# Patient Record
Sex: Female | Born: 2000 | Race: Asian | Hispanic: No | Marital: Single | Smoking: Never smoker
Health system: Southern US, Community
[De-identification: ages and names within clinical notes are randomized; demographics above are authoritative.]

## PROBLEM LIST (undated history)

## (undated) DIAGNOSIS — J302 Other seasonal allergic rhinitis: Secondary | ICD-10-CM

---
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
## 2021-01-24 ENCOUNTER — Ambulatory Visit (HOSPITAL_COMMUNITY)
Admission: EM | Admit: 2021-01-24 | Discharge: 2021-01-24 | Disposition: A | Discharge status: Home / Self Care | Payer: Medicaid Other | Attending: Physician Assistant | Admitting: Physician Assistant

## 2021-01-24 ENCOUNTER — Encounter (HOSPITAL_COMMUNITY): Payer: Self-pay

## 2021-01-24 ENCOUNTER — Other Ambulatory Visit: Payer: Self-pay

## 2021-01-24 DIAGNOSIS — N3001 Acute cystitis with hematuria: Secondary | ICD-10-CM | POA: Diagnosis not present

## 2021-01-24 DIAGNOSIS — R350 Frequency of micturition: Secondary | ICD-10-CM | POA: Insufficient documentation

## 2021-01-24 DIAGNOSIS — R300 Dysuria: Secondary | ICD-10-CM | POA: Insufficient documentation

## 2021-01-24 HISTORY — DX: Other seasonal allergic rhinitis: J30.2

## 2021-01-24 LAB — POCT URINALYSIS DIPSTICK, ED / UC
Bilirubin Urine: NEGATIVE
Glucose, UA: NEGATIVE mg/dL
Ketones, ur: NEGATIVE mg/dL
Nitrite: NEGATIVE
Protein, ur: NEGATIVE mg/dL
Specific Gravity, Urine: 1.01 (ref 1.005–1.030)
Urobilinogen, UA: 0.2 mg/dL (ref 0.0–1.0)
pH: 6.5 (ref 5.0–8.0)

## 2021-01-24 MED ORDER — NITROFURANTOIN MONOHYD MACRO 100 MG PO CAPS: 100.0000 mg | ORAL_CAPSULE | Freq: Two times a day (BID) | ORAL | 0 refills | Status: AC

## 2021-01-24 NOTE — Discharge Instructions (Signed)
Start antibiotics twice daily for a week.  We will contact you if we need to change antibiotics based on your culture results in a few days.  Make sure you are drinking plenty of fluid.  You can alternate Tylenol ibuprofen for pain.  If you have any worsening symptoms you need to be reevaluated as we discussed.

## 2021-01-24 NOTE — ED Triage Notes (Signed)
Patient presents to Urgent Care with complaints of bilateral back pain, abdominal pain, dysuria, increased urgency and frequency since yesterday. Treating symptoms with tylenol.   Denies fever.

## 2021-01-24 NOTE — ED Provider Notes (Signed)
[facility]    CSN: [id number] Arrival date & time: 01/24/21  1117      History   Chief Complaint Chief Complaint  Patient presents with   Urinary Frequency   Urinary Urgency   Dysuria    HPI Sandra Oconnell is a 20 y.o. female.   Patient presents today with a 2-day history of urinary tract infection symptoms.  She reports back pain, lower abdominal pain, dysuria, frequency, urgency.  She denies any hematuria, fever, nausea, vomiting, changes in bowel habits.  She has tried Tylenol with minimal improvement of symptoms.  Denies history of UTI, history of nephrolithiasis, recent antibiotic use, seeing a urologist in the past.  Denies any recent urogenital procedure or catheterization.  She is, that she is not pregnant; reports that she is currently on her menstrual cycle though this was a week late.  She has taken 2 at home test that were negative and she is confident she is not pregnant.   Past Medical History:  Diagnosis Date   Seasonal allergies     Patient Active Problem List   Diagnosis Date Noted   Immunization due 03/08/2020   Failure to thrive (0-17) 02/13/2020   Weight decreased 01/17/2019   Acne 05/12/2017   Myopia of both eyes 02/20/2014    History reviewed. No pertinent surgical history.  OB History   No obstetric history on file.      Home Medications    Prior to Admission medications   Medication Sig Start Date End Date Taking? Authorizing Provider  nitrofurantoin, macrocrystal-monohydrate, (MACROBID) 100 MG capsule Take 1 capsule (100 mg total) by mouth 2 (two) times daily for 7 days. 01/24/21 01/31/21 Yes Katesha Eichel K, PA-C  albuterol (PROVENTIL HFA;VENTOLIN HFA) 108 (90 Base) MCG/ACT inhaler Inhale 2 puffs into the lungs every 6 (six) hours as needed for wheezing or shortness of breath. 04/10/16   Uvaldo Rising, MD  brompheniramine-pseudoephedrine-DM 30-2-10 MG/5ML syrup Take 5 mLs by mouth 4 (four) times daily as needed. 06/18/18   Cathie Hoops, Amy V,  PA-C  cetirizine (ZYRTEC) 10 MG tablet Take 1 tablet (10 mg total) by mouth daily. 07/27/18   Bast, Gloris Manchester A, NP  fluticasone (FLONASE) 50 MCG/ACT nasal spray Place 2 sprays into both nostrils daily. 06/18/18   Cathie Hoops, Amy V, PA-C  ipratropium (ATROVENT) 0.06 % nasal spray Place 2 sprays into both nostrils 4 (four) times daily. 06/18/18   Cathie Hoops, Amy V, PA-C  naproxen (NAPROSYN) 500 MG tablet Take 1 tablet (500 mg total) by mouth 2 (two) times daily with a meal. 12/18/18   Bing Neighbors, FNP    Family History History reviewed. No pertinent family history.  Social History Social History   Tobacco Use   Smoking status: Never    Passive exposure: Never   Smokeless tobacco: Never  Vaping Use   Vaping Use: Never used  Substance Use Topics   Alcohol use: Not Currently   Drug use: Never     Allergies   Patient has no known allergies.   Review of Systems Review of Systems  Constitutional:  Positive for activity change. Negative for appetite change, fatigue and fever.  Respiratory:  Negative for cough and shortness of breath.   Cardiovascular:  Negative for chest pain.  Gastrointestinal:  Positive for abdominal pain. Negative for diarrhea, nausea and vomiting.  Genitourinary:  Positive for dysuria, frequency, urgency and vaginal bleeding. Negative for flank pain, pelvic pain, vaginal discharge and vaginal pain.  Neurological:  Negative for dizziness,  light-headedness and headaches.    Physical Exam Triage Vital Signs ED Triage Vitals  Enc Vitals Group     BP 01/24/21 1247 106/72     Pulse Rate 01/24/21 1247 74     Resp 01/24/21 1247 16     Temp 01/24/21 1247 98.6 F (37 C)     Temp Source 01/24/21 1247 Oral     SpO2 01/24/21 1247 98 %     Weight --      Height --      Head Circumference --      Peak Flow --      Pain Score 01/24/21 1244 7     Pain Loc --      Pain Edu? --      Excl. in GC? --    No data found.  Updated Vital Signs BP 106/72 (BP Location: Right Arm)    Pulse 74   Temp 98.6 F (37 C) (Oral)   Resp 16   LMP 01/22/2021   SpO2 98%   Visual Acuity Right Eye Distance:   Left Eye Distance:   Bilateral Distance:    Right Eye Near:   Left Eye Near:    Bilateral Near:     Physical Exam Vitals reviewed.  Constitutional:      General: She is awake. She is not in acute distress.    Appearance: Normal appearance. She is normal weight. She is not ill-appearing.     Comments: Very pleasant female appears at age in no acute distress sitting comfortably in exam room  HENT:     Head: Normocephalic and atraumatic.  Cardiovascular:     Rate and Rhythm: Normal rate and regular rhythm.     Heart sounds: Normal heart sounds, S1 normal and S2 normal. No murmur heard. Pulmonary:     Effort: Pulmonary effort is normal.     Breath sounds: Normal breath sounds. No wheezing, rhonchi or rales.     Comments: Clear to auscultation bilaterally Abdominal:     General: Bowel sounds are normal.     Palpations: Abdomen is soft.     Tenderness: There is abdominal tenderness in the suprapubic area. There is no right CVA tenderness, left CVA tenderness, guarding or rebound.     Comments: Mild tenderness to palpation in suprapubic region.  No evidence of acute abdomen on exam.  No CVA tenderness.  Musculoskeletal:     Right lower leg: No edema.     Left lower leg: No edema.  Psychiatric:        Behavior: Behavior is cooperative.     UC Treatments / Results  Labs (all labs ordered are listed, but only abnormal results are displayed) Labs Reviewed  POCT URINALYSIS DIPSTICK, ED / UC - Abnormal; Notable for the following components:      Result Value   Hgb urine dipstick LARGE (*)    Leukocytes,Ua LARGE (*)    All other components within normal limits  URINE CULTURE    EKG   Radiology No results found.  Procedures Procedures (including critical care time)  Medications Ordered in UC Medications - No data to display  Initial Impression /  Assessment and Plan / UC Course  I have reviewed the triage vital signs and the nursing notes.  Pertinent labs & imaging results that were available during my care of the patient were reviewed by me and considered in my medical decision making (see chart for details).      UA showed leukocyte esterase  and hematuria.  Suspect hematuria is related to menstrual cycle but given clinical presentations with positive leukocytes we will treat for UTI.  Patient was started on Macrobid twice daily for a week.  She was instructed to drink plenty of fluid and rest.  Urine culture was obtained and we will contact patient if we need to arrange different treatment based on susceptibilities identified on culture.  Discussed alarm symptoms that warrant emergent evaluation.  Strict return precautions given to which patient expressed understanding.  Final Clinical Impressions(s) / UC Diagnoses   Final diagnoses:  Acute cystitis with hematuria  Dysuria  Urinary frequency     Discharge Instructions      Start antibiotics twice daily for a week.  We will contact you if we need to change antibiotics based on your culture results in a few days.  Make sure you are drinking plenty of fluid.  You can alternate Tylenol ibuprofen for pain.  If you have any worsening symptoms you need to be reevaluated as we discussed.     ED Prescriptions     Medication Sig Dispense Auth. Provider   nitrofurantoin, macrocrystal-monohydrate, (MACROBID) 100 MG capsule Take 1 capsule (100 mg total) by mouth 2 (two) times daily for 7 days. 14 capsule Zion Ta K, PA-C      PDMP not reviewed this encounter.   Jeani Hawking, PA-C 01/24/21 1314

## 2021-01-27 LAB — URINE CULTURE: Culture: >=100000 — AB

## 2021-02-20 ENCOUNTER — Encounter (HOSPITAL_COMMUNITY): Payer: Self-pay | Admitting: Emergency Medicine

## 2021-02-20 ENCOUNTER — Ambulatory Visit (HOSPITAL_COMMUNITY)
Admission: EM | Admit: 2021-02-20 | Discharge: 2021-02-20 | Disposition: A | Discharge status: Home / Self Care | Payer: Medicaid Other | Attending: Emergency Medicine | Admitting: Emergency Medicine

## 2021-02-20 ENCOUNTER — Other Ambulatory Visit: Payer: Self-pay

## 2021-02-20 DIAGNOSIS — N912 Amenorrhea, unspecified: Secondary | ICD-10-CM | POA: Diagnosis not present

## 2021-02-20 LAB — POC URINE PREG, ED: Preg Test, Ur: NEGATIVE

## 2021-02-20 LAB — POCT URINALYSIS DIPSTICK, ED / UC
Bilirubin Urine: NEGATIVE
Glucose, UA: NEGATIVE mg/dL
Ketones, ur: NEGATIVE mg/dL
Nitrite: NEGATIVE
Protein, ur: NEGATIVE mg/dL
Specific Gravity, Urine: 1.015 (ref 1.005–1.030)
Urobilinogen, UA: 0.2 mg/dL (ref 0.0–1.0)
pH: 7.5 (ref 5.0–8.0)

## 2021-02-20 NOTE — ED Triage Notes (Signed)
Home pregnancy tests were negative.  Patient reports having missed 2 periods.  Patient is not on birth control.   Patient is requesting testing to determine cycle issues

## 2021-02-20 NOTE — ED Provider Notes (Signed)
[facility]    CSN: [id number] Arrival date & time: 02/20/21  1237      History   Chief Complaint Chief Complaint  Patient presents with   Possible Pregnancy    HPI Sandra Oconnell is a 20 y.o. female.   Patient complains of missing her last 2 periods, states she has always been regular up to this point.  Patient states she has been sexually active since her last period but not within the last month, states she is not currently using any form of birth control.  Patient denies increased hair loss, feeling abnormally hot or cold, significant change in her weight, increased stress, changes in her diet, changes in her exercise regimen.  The history is provided by the patient.  Possible Pregnancy   Past Medical History:  Diagnosis Date   Seasonal allergies     Patient Active Problem List   Diagnosis Date Noted   Immunization due 03/08/2020   Failure to thrive (0-17) 02/13/2020   Weight decreased 01/17/2019   Acne 05/12/2017   Myopia of both eyes 02/20/2014    History reviewed. No pertinent surgical history.  OB History   No obstetric history on file.      Home Medications    Prior to Admission medications   Medication Sig Start Date End Date Taking? Authorizing Provider  albuterol (PROVENTIL HFA;VENTOLIN HFA) 108 (90 Base) MCG/ACT inhaler Inhale 2 puffs into the lungs every 6 (six) hours as needed for wheezing or shortness of breath. 04/10/16   Uvaldo Rising, MD  brompheniramine-pseudoephedrine-DM 30-2-10 MG/5ML syrup Take 5 mLs by mouth 4 (four) times daily as needed. 06/18/18   Cathie Hoops, Amy V, PA-C  cetirizine (ZYRTEC) 10 MG tablet Take 1 tablet (10 mg total) by mouth daily. 07/27/18   Bast, Gloris Manchester A, NP  fluticasone (FLONASE) 50 MCG/ACT nasal spray Place 2 sprays into both nostrils daily. 06/18/18   Cathie Hoops, Amy V, PA-C  ipratropium (ATROVENT) 0.06 % nasal spray Place 2 sprays into both nostrils 4 (four) times daily. 06/18/18   Cathie Hoops, Amy V, PA-C  naproxen (NAPROSYN) 500 MG  tablet Take 1 tablet (500 mg total) by mouth 2 (two) times daily with a meal. 12/18/18   Bing Neighbors, FNP    Family History Family History  Problem Relation Age of Onset   Healthy Mother    Healthy Father     Social History Social History   Tobacco Use   Smoking status: Never    Passive exposure: Never   Smokeless tobacco: Never  Vaping Use   Vaping Use: Never used  Substance Use Topics   Alcohol use: Not Currently   Drug use: Never     Allergies   Patient has no known allergies.   Review of Systems Review of Systems Per HPI  Physical Exam Triage Vital Signs ED Triage Vitals  Enc Vitals Group     BP 02/20/21 1410 102/65     Pulse Rate 02/20/21 1410 71     Resp 02/20/21 1410 18     Temp 02/20/21 1410 99.3 F (37.4 C)     Temp Source 02/20/21 1410 Oral     SpO2 02/20/21 1410 100 %     Weight --      Height --      Head Circumference --      Peak Flow --      Pain Score 02/20/21 1407 0     Pain Loc --      Pain Edu? --  Excl. in GC? --    No data found.  Updated Vital Signs BP 102/65 (BP Location: Left Arm)   Pulse 71   Temp 99.3 F (37.4 C) (Oral)   Resp 18   LMP 12/21/2020   SpO2 100%   Visual Acuity Right Eye Distance:   Left Eye Distance:   Bilateral Distance:    Right Eye Near:   Left Eye Near:    Bilateral Near:     Physical Exam Constitutional:      Appearance: Normal appearance.  HENT:     Head: Normocephalic and atraumatic.  Cardiovascular:     Rate and Rhythm: Normal rate and regular rhythm.     Heart sounds: Normal heart sounds.  Pulmonary:     Effort: Pulmonary effort is normal.     Breath sounds: Normal breath sounds.  Abdominal:     General: Abdomen is flat. Bowel sounds are normal.     Palpations: Abdomen is soft.  Musculoskeletal:        General: Normal range of motion.  Skin:    General: Skin is warm and dry.  Neurological:     General: No focal deficit present.     Mental Status: She is alert and  oriented to person, place, and time.  Psychiatric:        Mood and Affect: Mood normal.        Behavior: Behavior normal.     UC Treatments / Results  Labs (all labs ordered are listed, but only abnormal results are displayed) Labs Reviewed  POCT URINALYSIS DIPSTICK, ED / UC - Abnormal; Notable for the following components:      Result Value   Hgb urine dipstick TRACE (*)    Leukocytes,Ua TRACE (*)    All other components within normal limits  POC URINE PREG, ED    EKG   Radiology No results found.  Procedures Procedures (including critical care time)  Medications Ordered in UC Medications - No data to display  Initial Impression / Assessment and Plan / UC Course  I have reviewed the triage vital signs and the nursing notes.  Pertinent labs & imaging results that were available during my care of the patient were reviewed by me and considered in my medical decision making (see chart for details).     Urine pregnancy test today is negative.  Patient advised that continued unprotected sex will ultimately result in pregnancy.  Patient advised to follow-up with her primary care provider should her irregularity continue for 3 months total. Final Clinical Impressions(s) / UC Diagnoses   Final diagnoses:  Amenorrhea     Discharge Instructions      Your urine pregnancy test today is negative.  It is not uncommon for young women to have some menstrual irregularity.  This continues for 2-3 more months, I do recommend that you follow-up with primary care for further evaluation.  Remind that having sexual intercourse without using any form of birth control will result in a pregnancy.     ED Prescriptions   None    PDMP not reviewed this encounter.   Theadora Rama Scales, PA-C 02/20/21 1447

## 2021-02-20 NOTE — Discharge Instructions (Addendum)
Your urine pregnancy test today is negative.  It is not uncommon for young women to have some menstrual irregularity.  This continues for 2-3 more months, I do recommend that you follow-up with primary care for further evaluation.  Remind that having sexual intercourse without using any form of birth control will result in a pregnancy.

## 2021-04-18 ENCOUNTER — Other Ambulatory Visit: Payer: Self-pay

## 2021-04-18 ENCOUNTER — Ambulatory Visit (HOSPITAL_COMMUNITY)
Admission: EM | Admit: 2021-04-18 | Discharge: 2021-04-18 | Disposition: A | Discharge status: Home / Self Care | Payer: Medicaid Other | Attending: Emergency Medicine | Admitting: Emergency Medicine

## 2021-04-18 ENCOUNTER — Encounter (HOSPITAL_COMMUNITY): Payer: Self-pay | Admitting: *Deleted

## 2021-04-18 DIAGNOSIS — R051 Acute cough: Secondary | ICD-10-CM

## 2021-04-18 MED ORDER — PREDNISONE 10 MG (21) PO TBPK: ORAL_TABLET | ORAL | 0 refills | Status: DC

## 2021-04-18 MED ORDER — AZITHROMYCIN 250 MG PO TABS: 250.0000 mg | ORAL_TABLET | Freq: Every day | ORAL | 0 refills | Status: DC

## 2021-04-18 NOTE — ED Triage Notes (Signed)
Pt reports a cough for one week. No other Sx's.

## 2021-04-18 NOTE — Discharge Instructions (Addendum)
Take tapered prednisone as directed.  Take Azithromycin as directed.  Take Mucinex DM at night before bed.

## 2021-04-18 NOTE — ED Provider Notes (Signed)
[facility]  ____________________________________________  Time seen: Approximately 11:13 AM  I have reviewed the triage vital signs and the nursing notes.   HISTORY  Chief Complaint Cough   Historian Patient    HPI Sandra Oconnell is a 20 y.o. female presents to the urgent care with cough for 1 week.  Patient reports that symptoms started as viral URI-like symptoms.  She characterizes cough as dry in nature.  She denies shortness of breath, fatigue or malaise.  Patient denies chest pain, chest tightness or abdominal pain.     Past Medical History:  Diagnosis Date   Seasonal allergies      Immunizations up to date:  Yes.     Past Medical History:  Diagnosis Date   Seasonal allergies     Patient Active Problem List   Diagnosis Date Noted   Immunization due 03/08/2020   Failure to thrive (0-17) 02/13/2020   Weight decreased 01/17/2019   Acne 05/12/2017   Myopia of both eyes 02/20/2014    History reviewed. No pertinent surgical history.  Prior to Admission medications   Medication Sig Start Date End Date Taking? Authorizing Provider  azithromycin (ZITHROMAX) 250 MG tablet Take 1 tablet (250 mg total) by mouth daily. Take first 2 tablets together, then 1 every day until finished. 04/18/21  Yes Pia Mau M, PA-C  predniSONE (STERAPRED UNI-PAK 21 TAB) 10 MG (21) TBPK tablet 6,5,4,3,2,1 04/18/21  Yes Joseph Art, Lockie Pares M, PA-C  albuterol (PROVENTIL HFA;VENTOLIN HFA) 108 (90 Base) MCG/ACT inhaler Inhale 2 puffs into the lungs every 6 (six) hours as needed for wheezing or shortness of breath. 04/10/16   Uvaldo Rising, MD  brompheniramine-pseudoephedrine-DM 30-2-10 MG/5ML syrup Take 5 mLs by mouth 4 (four) times daily as needed. 06/18/18   Cathie Hoops, Amy V, PA-C  cetirizine (ZYRTEC) 10 MG tablet Take 1 tablet (10 mg total) by mouth daily. 07/27/18   Bast, Gloris Manchester A, NP  fluticasone (FLONASE) 50 MCG/ACT nasal spray Place 2 sprays into both nostrils daily. 06/18/18   Cathie Hoops, Amy V,  PA-C  ipratropium (ATROVENT) 0.06 % nasal spray Place 2 sprays into both nostrils 4 (four) times daily. 06/18/18   Cathie Hoops, Amy V, PA-C  naproxen (NAPROSYN) 500 MG tablet Take 1 tablet (500 mg total) by mouth 2 (two) times daily with a meal. 12/18/18   Bing Neighbors, FNP    Allergies Patient has no known allergies.  Family History  Problem Relation Age of Onset   Healthy Mother    Healthy Father     Social History Social History   Tobacco Use   Smoking status: Never    Passive exposure: Never   Smokeless tobacco: Never  Vaping Use   Vaping Use: Never used  Substance Use Topics   Alcohol use: Not Currently   Drug use: Never     Review of Systems  Constitutional: No fever/chills Eyes:  No discharge ENT: No upper respiratory complaints. Respiratory: Patient has cough.  Gastrointestinal:   No nausea, no vomiting.  No diarrhea.  No constipation. Musculoskeletal: Negative for musculoskeletal pain. Skin: Negative for rash, abrasions, lacerations, ecchymosis.    ____________________________________________   PHYSICAL EXAM:  VITAL SIGNS: ED Triage Vitals  Enc Vitals Group     BP 04/18/21 1044 105/70     Pulse Rate 04/18/21 1044 94     Resp 04/18/21 1044 18     Temp 04/18/21 1044 98.8 F (37.1 C)     Temp src --      SpO2 04/18/21  1044 97 %     Weight 04/18/21 1042 104 lb (47.2 kg)     Height --      Head Circumference --      Peak Flow --      Pain Score 04/18/21 1041 0     Pain Loc --      Pain Edu? --      Excl. in GC? --      Constitutional: Alert and oriented. Well appearing and in no acute distress. Eyes: Conjunctivae are normal. PERRL. EOMI. Head: Atraumatic. ENT:      Ears: Tms are pearly.       Nose: No congestion/rhinnorhea.      Mouth/Throat: Mucous membranes are moist.  Neck: No stridor.  No cervical spine tenderness to palpation. Cardiovascular: Normal rate, regular rhythm. Normal S1 and S2.  Good peripheral circulation. Respiratory: Normal  respiratory effort without tachypnea or retractions. Lungs CTAB. Good air entry to the bases with no decreased or absent breath sounds Gastrointestinal: Bowel sounds x 4 quadrants. Soft and nontender to palpation. No guarding or rigidity. No distention. Musculoskeletal: Full range of motion to all extremities. No obvious deformities noted Neurologic:  Normal for age. No gross focal neurologic deficits are appreciated.  Skin:  Skin is warm, dry and intact. No rash noted. Psychiatric: Mood and affect are normal for age. Speech and behavior are normal.   ____________________________________________   LABS (all labs ordered are listed, but only abnormal results are displayed)  Labs Reviewed - No data to display ____________________________________________  EKG   ____________________________________________  RADIOLOGY   No results found.  ____________________________________________    PROCEDURES  Procedure(s) performed:     Procedures     Medications - No data to display   ____________________________________________   INITIAL IMPRESSION / ASSESSMENT AND PLAN / ED COURSE  Pertinent labs & imaging results that were available during my care of the patient were reviewed by me and considered in my medical decision making (see chart for details).      Assessment and plan Cough 20 year old female presents to the urgent care with cough for 1 week.  Vital signs are reassuring at triage.  On physical exam, patient was alert, active and nontoxic-appearing.  We will treat with prednisone and azithromycin.  Return precautions were given to return with new or worsening symptoms.     ____________________________________________  FINAL CLINICAL IMPRESSION(S) / ED DIAGNOSES  Final diagnoses:  Acute cough      NEW MEDICATIONS STARTED DURING THIS VISIT:  ED Discharge Orders          Ordered    predniSONE (STERAPRED UNI-PAK 21 TAB) 10 MG (21) TBPK tablet         04/18/21 1108    azithromycin (ZITHROMAX) 250 MG tablet  Daily        04/18/21 1108                This chart was dictated using voice recognition software/Dragon. Despite best efforts to proofread, errors can occur which can change the meaning. Any change was purely unintentional.     Orvil Feil, PA-C 04/18/21 1116

## 2021-05-07 DIAGNOSIS — H5213 Myopia, bilateral: Secondary | ICD-10-CM | POA: Diagnosis not present

## 2021-06-30 NOTE — Progress Notes (Signed)
SUBJECTIVE:   Chief compliant/HPI: annual examination  Sandra Oconnell is a 21 y.o. who presents today for an annual exam.  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   History of weight loss: weight today 111 lb. Denies body dysmorphia, purging.  Social: Vapes; Gym 3-4 day/wk for 1-1.5 hour. Lives with parents. Has been in a long-term relationship with partner for 1 year. Condoms for protection LMP: Dec 12/28-1/1; normal monthly periods. Takes Tylenol for cramps.  Review of systems form notable for none.   Updated history tabs and problem list.   OBJECTIVE:   BP 114/79    Pulse 84    Ht 4' 11"  (1.499 m)    Wt 111 lb 8 oz (50.6 kg)    LMP 06/05/2021    SpO2 99%    BMI 22.52 kg/m   General: Awake, alert, oriented, in no acute distress, pleasant and cooperative with examination HEENT: Normocephalic, atraumatic, nares patent, dentition is good, oropharynx without erythema or exudates, TM's clear bilaterally, no thyroid nodules palpated Cardio: RRR without murmur, 2+ radial, DP and PT pulses b/l Respiratory: CTAB without wheezing/rhonchi/rales Abdomen: Soft, non-tender to palpation of all quadrants, non-distended MSK: Able to move all extremities spontaneously, no abnormalities Extremities: without edema or cyanosis Neuro: Speech is clear and intact, no focal deficits, no facial asymmetry, follows commands  Psych: Normal mood and affect  Depression screen [facility] 2/9 07/01/2021 03/08/2020 02/10/2020  Decreased Interest 0 0 0  Down, Depressed, Hopeless 1 0 0  PHQ - 2 Score 1 0 0  Altered sleeping 2 0 0  Tired, decreased energy 1 0 0  Change in appetite 0 1 0  Feeling bad or failure about yourself  1 0 0  Trouble concentrating 0 0 0  Moving slowly or fidgety/restless 0 0 0  Suicidal thoughts 0 0 0  PHQ-9 Score 5 1 0  Difficult doing work/chores Not difficult at all Not difficult at all -   ASSESSMENT/PLAN:   History of  anemia Reports history of anemia.  Last CBC collected 02/2020 which showed hemoglobin of 14.2, MCV 74.  She did not have any iron studies at that time.  Patient reports that she has made a conscious effort to improve her diet and get her weight up.  She would like CBC checked again today. -Congratulated on efforts to improve diet and weight -CBC  Current every day vaping We discussed health concerns that come with vaping.  She does desire to stop but reports it is hard as her social circle and partner also vape.  -Provided with 1-800-QUIT-NOW line     Annual Examination  See AVS for age appropriate recommendations.   PHQ score 5, reviewed and discussed. Blood pressure reviewed and at goal.  Asked about intimate partner violence and patient reports none.  The patient currently uses condoms for contraception. Folate recommended as appropriate, minimum of 400 mcg per day.  Advanced directives not discussed  Considered the following items based upon USPSTF recommendations: HIV testing: discussed and declined Hepatitis C: discussed and ordered Hepatitis B: discussed and declined Syphilis if at high risk: discussed and declined GC/CT  discussed and declined Lipid panel (nonfasting or fasting) discussed based upon AHA recommendations and not ordered.  Consider repeat every 4-6 years.  Reviewed risk factors for latent tuberculosis and not indicated  Discussed family history, BRCA testing not indicated. Tool used to risk stratify was [employer], retail  Cervical cancer screening: not indicated as not yet age 12.  Immunizations Flu and 1st HPV vaccine  Follow up in 1 year or sooner if indicated.    Sharion Settler, [place]

## 2021-06-30 NOTE — Patient Instructions (Signed)
It was wonderful to see you today.  Please bring ALL of your medications with you to every visit.   Today we talked about:  Today at your annual preventive visit we talked about the following measures: I recommend 150 minutes of exercise per week-try 30 minutes 5 days per week We discussed reducing sugary beverages (like soda and juice) and increasing leafy greens and whole fruits.  We discussed avoiding tobacco and alcohol.  I recommend avoiding illicit substances.  Your blood pressure is at goal of <120/80.   You can call 1-800-QUIT-NOW for help with tobacco cessation.    Thank you for choosing [facility].   Please call [phone] with any questions about today's appointment.  Please be sure to schedule follow up at the front  desk before you leave today.   Sabino Dick, DO PGY-2 Family Medicine

## 2021-07-01 ENCOUNTER — Ambulatory Visit (INDEPENDENT_AMBULATORY_CARE_PROVIDER_SITE_OTHER): Payer: Medicaid Other | Admitting: Family Medicine

## 2021-07-01 ENCOUNTER — Other Ambulatory Visit: Payer: Self-pay

## 2021-07-01 ENCOUNTER — Encounter: Payer: Self-pay | Admitting: Family Medicine

## 2021-07-01 VITALS — BP 114/79 | HR 84 | Ht 59.0 in | Wt 111.5 lb

## 2021-07-01 DIAGNOSIS — Z0000 Encounter for general adult medical examination without abnormal findings: Secondary | ICD-10-CM | POA: Diagnosis not present

## 2021-07-01 DIAGNOSIS — Z23 Encounter for immunization: Secondary | ICD-10-CM

## 2021-07-01 DIAGNOSIS — Z862 Personal history of diseases of the blood and blood-forming organs and certain disorders involving the immune mechanism: Secondary | ICD-10-CM | POA: Insufficient documentation

## 2021-07-01 DIAGNOSIS — Z7289 Other problems related to lifestyle: Secondary | ICD-10-CM | POA: Diagnosis not present

## 2021-07-01 DIAGNOSIS — Z1159 Encounter for screening for other viral diseases: Secondary | ICD-10-CM

## 2021-07-01 NOTE — Assessment & Plan Note (Signed)
We discussed health concerns that come with vaping.  She does desire to stop but reports it is hard as her social circle and partner also vape.  -Provided with 1-800-QUIT-NOW line

## 2021-07-01 NOTE — Assessment & Plan Note (Signed)
Reports history of anemia.  Last CBC collected 02/2020 which showed hemoglobin of 14.2, MCV 74.  She did not have any iron studies at that time.  Patient reports that she has made a conscious effort to improve her diet and get her weight up.  She would like CBC checked again today. -Congratulated on efforts to improve diet and weight -CBC

## 2021-07-02 LAB — HCV INTERPRETATION

## 2021-07-02 LAB — HCV AB W REFLEX TO QUANT PCR: HCV Ab: <0.1 s/co ratio (ref 0.0–0.9)

## 2021-07-10 ENCOUNTER — Other Ambulatory Visit: Payer: Self-pay | Admitting: Family Medicine

## 2021-07-10 ENCOUNTER — Encounter: Payer: Self-pay | Admitting: Family Medicine

## 2021-07-10 DIAGNOSIS — Z862 Personal history of diseases of the blood and blood-forming organs and certain disorders involving the immune mechanism: Secondary | ICD-10-CM

## 2021-11-12 ENCOUNTER — Encounter: Payer: Self-pay | Admitting: *Deleted
